# Patient Record
Sex: Male | Born: 1960 | Race: White | Hispanic: No | Marital: Married | Smoking: Never smoker
Health system: Southern US, Community
[De-identification: ages and names within clinical notes are randomized; demographics above are authoritative.]

## PROBLEM LIST (undated history)

## (undated) DIAGNOSIS — I10 Essential (primary) hypertension: Secondary | ICD-10-CM

## (undated) DIAGNOSIS — C801 Malignant (primary) neoplasm, unspecified: Secondary | ICD-10-CM

## (undated) HISTORY — PX: HERNIA REPAIR: SHX51

## (undated) HISTORY — PX: EYE SURGERY: SHX253

---

## 2016-03-09 ENCOUNTER — Other Ambulatory Visit: Payer: Self-pay | Admitting: Gastroenterology

## 2016-03-09 DIAGNOSIS — K6289 Other specified diseases of anus and rectum: Secondary | ICD-10-CM

## 2016-03-10 ENCOUNTER — Ambulatory Visit
Admission: RE | Admit: 2016-03-10 | Discharge: 2016-03-10 | Disposition: A | Discharge status: Home / Self Care | Payer: BLUE CROSS/BLUE SHIELD | Source: Ambulatory Visit | Attending: Gastroenterology | Admitting: Gastroenterology

## 2016-03-10 ENCOUNTER — Other Ambulatory Visit: Payer: Self-pay | Admitting: Gastroenterology

## 2016-03-10 DIAGNOSIS — K6289 Other specified diseases of anus and rectum: Secondary | ICD-10-CM

## 2016-03-10 MED ORDER — IOPAMIDOL (ISOVUE-300) INJECTION 61%
100.0000 mL | Freq: Once | INTRAVENOUS | Status: AC | PRN
  Administered 2016-03-10: 100 mL via INTRAVENOUS

## 2016-03-11 ENCOUNTER — Ambulatory Visit (HOSPITAL_COMMUNITY)
Admission: RE | Admit: 2016-03-11 | Discharge: 2016-03-11 | Disposition: A | Discharge status: Home / Self Care | Payer: BLUE CROSS/BLUE SHIELD | Source: Ambulatory Visit | Attending: Gastroenterology | Admitting: Gastroenterology

## 2016-03-11 ENCOUNTER — Encounter (HOSPITAL_COMMUNITY): Payer: Self-pay | Admitting: *Deleted

## 2016-03-11 ENCOUNTER — Encounter (HOSPITAL_COMMUNITY)
Admission: RE | Disposition: A | Discharge status: Home / Self Care | Payer: Self-pay | Source: Ambulatory Visit | Attending: Gastroenterology

## 2016-03-11 DIAGNOSIS — C20 Malignant neoplasm of rectum: Secondary | ICD-10-CM | POA: Diagnosis not present

## 2016-03-11 HISTORY — DX: Essential (primary) hypertension: I10

## 2016-03-11 HISTORY — PX: EUS: SHX5427

## 2016-03-11 HISTORY — PX: FLEXIBLE SIGMOIDOSCOPY: SHX5431

## 2016-03-11 SURGERY — ULTRASOUND, LOWER GI TRACT, ENDOSCOPIC
Anesthesia: Moderate Sedation

## 2016-03-11 MED ORDER — SODIUM CHLORIDE 0.9 % IV SOLN: INTRAVENOUS | Status: DC

## 2016-03-11 MED ORDER — FENTANYL CITRATE (PF) 100 MCG/2ML IJ SOLN
INTRAMUSCULAR | Status: AC
  Filled 2016-03-11: qty 2

## 2016-03-11 MED ORDER — FENTANYL CITRATE (PF) 100 MCG/2ML IJ SOLN
INTRAMUSCULAR | Status: DC | PRN
  Administered 2016-03-11 (×2): 25 ug via INTRAVENOUS

## 2016-03-11 MED ORDER — SPOT INK MARKER SYRINGE KIT
PACK | SUBMUCOSAL | Status: AC
  Filled 2016-03-11: qty 5

## 2016-03-11 MED ORDER — MIDAZOLAM HCL 5 MG/ML IJ SOLN
INTRAMUSCULAR | Status: AC
  Filled 2016-03-11: qty 2

## 2016-03-11 MED ORDER — SPOT INK MARKER SYRINGE KIT
PACK | SUBMUCOSAL | Status: DC | PRN
  Administered 2016-03-11: 1 mL via SUBMUCOSAL

## 2016-03-11 MED ORDER — MIDAZOLAM HCL 10 MG/2ML IJ SOLN
INTRAMUSCULAR | Status: DC | PRN
  Administered 2016-03-11: 1 mg via INTRAVENOUS
  Administered 2016-03-11 (×3): 2 mg via INTRAVENOUS

## 2016-03-11 NOTE — Discharge Instructions (Signed)

## 2016-03-11 NOTE — H&P (Signed)
Patient interval history reviewed.  Patient examined again.  There has been no change from documented H/P dated 03/10/16 (scanned into chart from our office) except as documented above.  Assessment:  1.  Rectal adenocarcinoma.  Plan:  1.  Endorectal ultrasound, for locoregional staging, with possible tattoo ([place] Ink) placement. 2.  Risks (bleeding, infection, bowel perforation that could require surgery, sedation-related changes in cardiopulmonary systems), benefits (identification and possible treatment of source of symptoms, exclusion of certain causes of symptoms), and alternatives (watchful waiting, radiographic imaging studies, empiric medical treatment) of endorectal ultrasound were explained to patient/family in detail and patient wishes to proceed.

## 2016-03-11 NOTE — Op Note (Signed)
[facility] Patient Name: Darryl Foster Procedure Date: 03/11/2016 MRN: [id number] Attending MD: Pedrohenrique Outlaw , MD Date of Birth: 12/24/1960 CSN:  Age: 54 Admit Type: Outpatient Procedure:                Lower EUS Indications:              Abnormal wall thickening in the rectum seen on CT                            scan, Rectal mucosal mass found on flex                            sig/colonoscopy (biopsy-proven adenocarcinoma) Providers:                Abass Outlaw, MD, Patricia Ford, RN, Sam Tetteh,                            Technician Referring MD:             James L. Edwards, MD, Alicia Thomas, MD Medicines:                Fentanyl 50 micrograms IV, Midazolam 6 mg IV Complications:            No immediate complications. Estimated Blood Loss:     Estimated blood loss was minimal. Procedure:                Pre-Anesthesia Assessment:                           - Prior to the procedure, a History and Physical                            was performed, and patient medications and                            allergies were reviewed. The patient's tolerance of                            previous anesthesia was also reviewed. The risks                            and benefits of the procedure and the sedation                            options and risks were discussed with the patient.                            All questions were answered, and informed consent                            was obtained. Prior Anticoagulants: The patient has                            taken no previous anticoagulant or antiplatelet                              agents. ASA Grade Assessment: I - A normal, healthy                            patient. After reviewing the risks and benefits,                            the patient was deemed in satisfactory condition to                            undergo the procedure.                           After obtaining informed consent, the endoscope was                             passed under direct vision. Throughout the                            procedure, the patient's blood pressure, pulse, and                            oxygen saturations were monitored continuously. The                            [id number] ([id number]) scope was introduced through                            the anus and advanced to the the sigmoid colon for                            ultrasound. The [id number] ([id number]) scope was                            introduced through the anus and advanced to the the                            sigmoid colon for ultrasound. The lower EUS was                            accomplished without difficulty. The patient                            tolerated the procedure well. The quality of the                            bowel preparation was good. Scope In: Scope Out: Findings:      The perianal and digital rectal examinations were normal.      Endoscopic Finding :      A sessile and ulcerated non-obstructing medium-sized mass was found in       the mid rectum. The mass was non-circumferential. No bleeding was       present. Area was tattooed with an injection of 1 mL of [place] ink.      Endosonographic Finding :        The perirectal space was normal.      No lymph nodes were seen in the perirectal region during endosonographic       examination of the rectum.      A hypoechoic and homogenous mass was found in the rectum. The mass was       lateral. The endosonographic borders were well-defined. The mass       measured 15 mm (in maximum diameter). There was sonographic evidence       suggesting invasion into the submucosa (Layer 3); the lesion appears to       closely border, perhaps even abut, the muscularis propria. There was no       sonographic evidence of invasion into the perirectal fat, visceral       peritoneum, adjacent structures or prostate. Impression:               - Malignant tumor in the mid rectum. Tattooed.                            - Endosonographic images of the perirectal space                            were unremarkable.                           - No lymph nodes were seen in the perirectal region                            during endosonographic examination of the rectum.                           - Rectal mass was visualized endosonographically. A                            tissue diagnosis was obtained prior to this exam.                            This is of adenocarcinoma. This was best felt to be                            staged uT1 uN0, but very early invasion into                            muscularis propria (uT2), while not favored, can                            not be definitively excluded. Moderate Sedation:      Moderate (conscious) sedation was administered by the endoscopy nurse       and supervised by the endoscopist. The following parameters were       monitored: oxygen saturation, heart rate, blood pressure, and response       to care. Recommendation:           - The patient will be observed post-procedure,                            until all  discharge criteria are met.                           - Discharge patient to home (via wheelchair).                           - Resume previous diet today.                           - Continue present medications.                           - Refer to a colo-rectal surgeon at appointment to                            be scheduled.                           - Return to referring physician as previously                            scheduled.                           - Return to GI clinic PRN. Procedure Code(s):        --- Professional ---                           45341, Sigmoidoscopy, flexible; with endoscopic                            ultrasound examination                           45335, Sigmoidoscopy, flexible; with directed                            submucosal injection(s), any substance Diagnosis Code(s):        --- Professional  ---                           C20, Malignant neoplasm of rectum                           K62.89, Other specified diseases of anus and rectum                           R93.3, Abnormal findings on diagnostic imaging of                            other parts of digestive tract CPT copyright 2016 American Medical Association. All rights reserved. The codes documented in this report are preliminary and upon coder review may  be revised to meet current compliance requirements. Eyad Outlaw, MD Nimesh Outlaw, MD 03/11/2016 8:19:42 AM This report has been signed electronically. Number of Addenda: 0 

## 2016-03-16 ENCOUNTER — Encounter (HOSPITAL_COMMUNITY): Payer: Self-pay | Admitting: Gastroenterology

## 2016-03-16 ENCOUNTER — Other Ambulatory Visit: Payer: Self-pay | Admitting: General Surgery

## 2016-03-16 NOTE — H&P (Signed)
History of Present Illness Darryl Ruff MD; [id number] 10:08 AM) Patient words: cancer.  The patient is a 55 year old male who presents with colorectal cancer. Pt goes by Darryl Foster. 55 year old male status post rectal cancer found on screening colonoscopy by Dr. Oletta Lamas. He underwent a rectal ultrasound which revealed a a 15 mm lateral mass with invasion into the submucosa and possible muscularis invasion, T1 N0 or possible T2 N0. This was tattooed with [place] ink. CT scans of the chest abdomen and pelvis reveal no signs of metastatic disease. There were some small bilateral pulmonary nodules that will need follow-up in the future in 3-6 months. CEA level was 1.5. The mass is located approximately 10 cm from the anal verge.   Other Problems Darryl Lorenzo, LPN; 624THL [id number] AM) High blood pressure Rectal Cancer  Past Surgical History Darryl Lorenzo, LPN; 624THL [id number] AM) No pertinent past surgical history  Diagnostic Studies History Darryl Lorenzo, LPN; 624THL [id number] AM) Colonoscopy within last year  Allergies Darryl Lorenzo, LPN; 624THL [id number] AM) No Known Drug Allergies 03/16/2016  Medication History Darryl Lorenzo, LPN; 624THL [id number] AM) Lisinopril (10MG  Tablet, Oral) Active. Nasacort (55MCG/ACT Aerosol Soln, Nasal) Active. Medications Reconciled  Social History Darryl Lorenzo, LPN; 624THL [id number] AM) Alcohol use Remotely quit alcohol use. Caffeine use Coffee, Tea. No drug use Tobacco use Never smoker.  Family History Darryl Lorenzo, LPN; 624THL [id number] AM) Diabetes Mellitus Father. Heart Disease Father. Heart disease in male family member before age 84 Hypertension Father. Thyroid problems Mother, Sister.     Review of Systems Darryl Billings Dockery LPN; 624THL [id number] AM) General Not Present- Appetite Loss, Chills, Fatigue, Fever, Night Sweats, Weight Gain and Weight Loss. Skin Not Present- Change in Wart/Mole, Dryness, Hives, Jaundice, New Lesions,  Non-Healing Wounds, Rash and Ulcer. HEENT Present- Seasonal Allergies and Wears glasses/contact lenses. Not Present- Earache, Hearing Loss, Hoarseness, Nose Bleed, Oral Ulcers, Ringing in the Ears, Sinus Pain, Sore Throat, Visual Disturbances and Yellow Eyes. Respiratory Not Present- Bloody sputum, Chronic Cough, Difficulty Breathing, Snoring and Wheezing. Breast Not Present- Breast Mass, Breast Pain, Nipple Discharge and Skin Changes. Cardiovascular Not Present- Chest Pain, Difficulty Breathing Lying Down, Leg Cramps, Palpitations, Rapid Heart Rate, Shortness of Breath and Swelling of Extremities. Gastrointestinal Not Present- Abdominal Pain, Bloating, Bloody Stool, Change in Bowel Habits, Chronic diarrhea, Constipation, Difficulty Swallowing, Excessive gas, Gets full quickly at meals, Hemorrhoids, Indigestion, Nausea, Rectal Pain and Vomiting. Male Genitourinary Not Present- Blood in Urine, Change in Urinary Stream, Frequency, Impotence, Nocturia, Painful Urination, Urgency and Urine Leakage. Musculoskeletal Not Present- Back Pain, Joint Pain, Joint Stiffness, Muscle Pain, Muscle Weakness and Swelling of Extremities. Neurological Not Present- Decreased Memory, Fainting, Headaches, Numbness, Seizures, Tingling, Tremor, Trouble walking and Weakness. Psychiatric Not Present- Anxiety, Bipolar, Change in Sleep Pattern, Depression, Fearful and Frequent crying. Endocrine Not Present- Cold Intolerance, Excessive Hunger, Hair Changes, Heat Intolerance, Hot flashes and New Diabetes. Hematology Not Present- Easy Bruising, Excessive bleeding, Gland problems, HIV and Persistent Infections.  Vitals Darryl Billings Dockery LPN; 624THL 075-GRM AM) 03/16/2016 9:35 AM Weight: 185.6 lb Height: 67in Body Surface Area: 1.96 m Body Mass Index: 29.07 kg/m  Temp.: 97.38F(Oral)  Pulse: 119 (Regular)  BP: 148/92 (Sitting, Left Arm, Standard)      Physical Exam Darryl Ruff MD; [id number] 10:44  AM)  General Mental Status-Alert. General Appearance-Not in acute distress. Build & Nutrition-Well nourished. Posture-Normal posture. Gait-Normal.  Head and Neck Head-normocephalic, atraumatic with no lesions or palpable masses. Trachea-midline.  Chest and Lung Exam Chest and lung exam reveals -  on auscultation, normal breath sounds, no adventitious sounds and normal vocal resonance.  Cardiovascular Cardiovascular examination reveals -normal heart sounds, regular rate and rhythm with no murmurs.  Abdomen Inspection Inspection of the abdomen reveals - No Hernias. Palpation/Percussion Palpation and Percussion of the abdomen reveal - Soft, Non Tender, No Rigidity (guarding), No hepatosplenomegaly and No Palpable abdominal masses.  Rectal Note: deferred  Neurologic Neurologic evaluation reveals -alert and oriented x 3 with no impairment of recent or remote memory, normal attention span and ability to concentrate, normal sensation and normal coordination.  Musculoskeletal Normal Exam - Bilateral-Upper Extremity Strength Normal and Lower Extremity Strength Normal.    Assessment & Plan Darryl Ruff MD; [id number] 10:04 AM)  RECTAL CANCER (C20) Impression: 55 year old male status post rectal cancer found on screening colonoscopy. Ultrasound reveals early stage tumor. I have recommended low anterior resection. We have discussed this in detail. It is unlikely the patient will need any chemotherapy unless something changes on his final pathology. The surgery and anatomy were described to the patient as well as the risks of surgery and the possible complications. These include: Bleeding, deep abdominal infections and possible wound complications such as hernia and infection, damage to adjacent structures, leak of surgical connections, which can lead to other surgeries and possibly an ostomy, possible need for other procedures, such as abscess drains in radiology,  possible prolonged hospital stay, possible diarrhea from removal of part of the colon, possible constipation from narcotics, possible bowel, bladder or sexual dysfunction if having rectal surgery, prolonged fatigue/weakness or appetite loss, possible early recurrence of of disease, possible complications of their medical problems such as heart disease or arrhythmias or lung problems, death (less than 1%). I believe the patient understands and wishes to proceed with the surgery.

## 2016-03-18 NOTE — Patient Instructions (Addendum)
Darryl Foster  03/18/2016   Your procedure is scheduled on: 03/27/2016    Report to [facility] Main  Entrance take [place]  elevators to 3rd floor to  [place] at   0700 AM.  Call this number if you have problems the morning of surgery [phone]   Remember: ONLY 1 PERSON MAY GO WITH YOU TO SHORT STAY TO GET  READY MORNING OF YOUR SURGERY.  Do not eat food or drink liquids :After Midnight.             FOLLOW BOWEL PREP INSTRUCTIONS PER MD OFFICE     Take these medicines the morning of surgery with A SIP OF WATER: none                                 You may not have any metal on your body including hair pins and              piercings  Do not wear jewelry,  lotions, powders or perfumes, deodorant                         Men may shave face and neck.   Do not bring valuables to the hospital. [place].  Contacts, dentures or bridgework may not be worn into surgery.  Leave suitcase in the car. After surgery it may be brought to your room.               Please read over the following fact sheets you were given: _____________________________________________________________________             [facility] - Preparing for Surgery Before surgery, you can play an important role.  Because skin is not sterile, your skin needs to be as free of germs as possible.  You can reduce the number of germs on your skin by washing with CHG (chlorahexidine gluconate) soap before surgery.  CHG is an antiseptic cleaner which kills germs and bonds with the skin to continue killing germs even after washing. Please DO NOT use if you have an allergy to CHG or antibacterial soaps.  If your skin becomes reddened/irritated stop using the CHG and inform your nurse when you arrive at Short Stay. Do not shave (including legs and underarms) for at least 48 hours prior to the first CHG shower.  You may shave your face/neck. Please  follow these instructions carefully:  1.  Shower with CHG Soap the night before surgery and the  morning of Surgery.  2.  If you choose to wash your hair, wash your hair first as usual with your  normal  shampoo.  3.  After you shampoo, rinse your hair and body thoroughly to remove the  shampoo.                           4.  Use CHG as you would any other liquid soap.  You can apply chg directly  to the skin and wash                       Gently with a scrungie or clean washcloth.  5.  Apply the CHG Soap to your body ONLY FROM THE NECK DOWN.   Do not use on face/ open                           Wound or open sores. Avoid contact with eyes, ears mouth and genitals (private parts).                       Wash face,  Genitals (private parts) with your normal soap.             6.  Wash thoroughly, paying special attention to the area where your surgery  will be performed.  7.  Thoroughly rinse your body with warm water from the neck down.  8.  DO NOT shower/wash with your normal soap after using and rinsing off  the CHG Soap.                9.  Pat yourself dry with a clean towel.            10.  Wear clean pajamas.            11.  Place clean sheets on your bed the night of your first shower and do not  sleep with pets. Day of Surgery : Do not apply any lotions/deodorants the morning of surgery.  Please wear clean clothes to the hospital/surgery center.  FAILURE TO FOLLOW THESE INSTRUCTIONS MAY RESULT IN THE CANCELLATION OF YOUR SURGERY PATIENT SIGNATURE_________________________________  NURSE SIGNATURE__________________________________  ________________________________________________________________________  WHAT IS A BLOOD TRANSFUSION? Blood Transfusion Information  A transfusion is the replacement of blood or some of its parts. Blood is made up of multiple cells which provide different functions.  Red blood cells carry oxygen and are used for blood loss replacement.  White blood cells  fight against infection.  Platelets control bleeding.  Plasma helps clot blood.  Other blood products are available for specialized needs, such as hemophilia or other clotting disorders. BEFORE THE TRANSFUSION  Who gives blood for transfusions?   Healthy volunteers who are fully evaluated to make sure their blood is safe. This is blood bank blood. Transfusion therapy is the safest it has ever been in the practice of medicine. Before blood is taken from a donor, a complete history is taken to make sure that person has no history of diseases nor engages in risky social behavior (examples are intravenous drug use or sexual activity with multiple partners). The donor's travel history is screened to minimize risk of transmitting infections, such as malaria. The donated blood is tested for signs of infectious diseases, such as HIV and hepatitis. The blood is then tested to be sure it is compatible with you in order to minimize the chance of a transfusion reaction. If you or a relative donates blood, this is often done in anticipation of surgery and is not appropriate for emergency situations. It takes many days to process the donated blood. RISKS AND COMPLICATIONS Although transfusion therapy is very safe and saves many lives, the main dangers of transfusion include:   Getting an infectious disease.  Developing a transfusion reaction. This is an allergic reaction to something in the blood you were given. Every precaution is taken to prevent this. The decision to have a blood transfusion has been considered carefully by your caregiver before blood is given. Blood is not given unless the benefits outweigh the risks. AFTER THE TRANSFUSION  Right after receiving a  blood transfusion, you will usually feel much better and more energetic. This is especially true if your red blood cells have gotten low (anemic). The transfusion raises the level of the red blood cells which carry oxygen, and this usually  causes an energy increase.  The nurse administering the transfusion will monitor you carefully for complications. HOME CARE INSTRUCTIONS  No special instructions are needed after a transfusion. You may find your energy is better. Speak with your caregiver about any limitations on activity for underlying diseases you may have. SEEK MEDICAL CARE IF:   Your condition is not improving after your transfusion.  You develop redness or irritation at the intravenous (IV) site. SEEK IMMEDIATE MEDICAL CARE IF:  Any of the following symptoms occur over the next 12 hours:  Shaking chills.  You have a temperature by mouth above 102 F (38.9 C), not controlled by medicine.  Chest, back, or muscle pain.  People around you feel you are not acting correctly or are confused.  Shortness of breath or difficulty breathing.  Dizziness and fainting.  You get a rash or develop hives.  You have a decrease in urine output.  Your urine turns a dark color or changes to pink, red, or brown. Any of the following symptoms occur over the next 10 days:  You have a temperature by mouth above 102 F (38.9 C), not controlled by medicine.  Shortness of breath.  Weakness after normal activity.  The white part of the eye turns yellow (jaundice).  You have a decrease in the amount of urine or are urinating less often.  Your urine turns a dark color or changes to pink, red, or brown. Document Released: 11/13/2000 Document Revised: 02/08/2012 Document Reviewed: 07/02/2008 [facility] Patient Information 2014 [place], [place].  _______________________________________________________________________

## 2016-03-20 ENCOUNTER — Encounter (HOSPITAL_COMMUNITY): Payer: Self-pay

## 2016-03-20 ENCOUNTER — Encounter (HOSPITAL_COMMUNITY)
Admission: RE | Admit: 2016-03-20 | Discharge: 2016-03-20 | Disposition: A | Discharge status: Home / Self Care | Payer: BLUE CROSS/BLUE SHIELD | Source: Ambulatory Visit | Attending: General Surgery | Admitting: General Surgery

## 2016-03-20 DIAGNOSIS — Z01810 Encounter for preprocedural cardiovascular examination: Secondary | ICD-10-CM | POA: Insufficient documentation

## 2016-03-20 DIAGNOSIS — Z01812 Encounter for preprocedural laboratory examination: Secondary | ICD-10-CM | POA: Diagnosis not present

## 2016-03-20 HISTORY — DX: Malignant (primary) neoplasm, unspecified: C80.1

## 2016-03-20 LAB — ABO/RH: ABO/RH(D): O POS

## 2016-03-20 NOTE — Pre-Procedure Instructions (Signed)
CBC with diff, CMP - 03/09/16 on chart CXR - 03/10/16 epic

## 2016-03-21 LAB — HEMOGLOBIN A1C
HEMOGLOBIN A1C: 6.4 % — AB (ref 4.8–5.6)
MEAN PLASMA GLUCOSE: 137 mg/dL

## 2016-03-23 NOTE — Progress Notes (Signed)
Final EKg done 03/20/16 in EPIC.

## 2016-03-27 ENCOUNTER — Inpatient Hospital Stay (HOSPITAL_COMMUNITY): Payer: BLUE CROSS/BLUE SHIELD | Admitting: Certified Registered"

## 2016-03-27 ENCOUNTER — Encounter (HOSPITAL_COMMUNITY): Payer: Self-pay | Admitting: Certified Registered"

## 2016-03-27 ENCOUNTER — Encounter (HOSPITAL_COMMUNITY)
Admission: RE | Disposition: A | Discharge status: Home / Self Care | Payer: Self-pay | Source: Ambulatory Visit | Attending: General Surgery

## 2016-03-27 ENCOUNTER — Inpatient Hospital Stay (HOSPITAL_COMMUNITY)
Admission: RE | Admit: 2016-03-27 | Discharge: 2016-03-30 | DRG: 331 | Disposition: A | Discharge status: Home / Self Care | Payer: BLUE CROSS/BLUE SHIELD | Source: Ambulatory Visit | Attending: General Surgery | Admitting: General Surgery

## 2016-03-27 DIAGNOSIS — I10 Essential (primary) hypertension: Secondary | ICD-10-CM | POA: Diagnosis present

## 2016-03-27 DIAGNOSIS — C19 Malignant neoplasm of rectosigmoid junction: Secondary | ICD-10-CM | POA: Diagnosis present

## 2016-03-27 DIAGNOSIS — Z833 Family history of diabetes mellitus: Secondary | ICD-10-CM | POA: Diagnosis not present

## 2016-03-27 DIAGNOSIS — C20 Malignant neoplasm of rectum: Secondary | ICD-10-CM | POA: Diagnosis present

## 2016-03-27 DIAGNOSIS — Z8249 Family history of ischemic heart disease and other diseases of the circulatory system: Secondary | ICD-10-CM

## 2016-03-27 DIAGNOSIS — R918 Other nonspecific abnormal finding of lung field: Secondary | ICD-10-CM | POA: Diagnosis present

## 2016-03-27 DIAGNOSIS — Z79899 Other long term (current) drug therapy: Secondary | ICD-10-CM | POA: Diagnosis not present

## 2016-03-27 HISTORY — PX: XI ROBOTIC ASSISTED LOWER ANTERIOR RESECTION: SHX6558

## 2016-03-27 LAB — TYPE AND SCREEN
ABO/RH(D): O POS
Antibody Screen: NEGATIVE

## 2016-03-27 SURGERY — RESECTION, RECTUM, LOW ANTERIOR, ROBOT-ASSISTED
Anesthesia: General | Site: Abdomen

## 2016-03-27 MED ORDER — OXYCODONE HCL 5 MG PO TABS: 5.0000 mg | ORAL_TABLET | Freq: Once | ORAL | Status: DC | PRN

## 2016-03-27 MED ORDER — SUGAMMADEX SODIUM 200 MG/2ML IV SOLN
INTRAVENOUS | Status: AC
  Filled 2016-03-27: qty 2

## 2016-03-27 MED ORDER — FLUTICASONE PROPIONATE 50 MCG/ACT NA SUSP
1.0000 | Freq: Every day | NASAL | Status: DC
  Administered 2016-03-27 – 2016-03-29 (×3): 1 via NASAL
  Filled 2016-03-27: qty 16

## 2016-03-27 MED ORDER — ONDANSETRON HCL 4 MG PO TABS: 4.0000 mg | ORAL_TABLET | Freq: Four times a day (QID) | ORAL | Status: DC | PRN

## 2016-03-27 MED ORDER — OXYCODONE HCL 5 MG/5ML PO SOLN
5.0000 mg | Freq: Once | ORAL | Status: DC | PRN
  Filled 2016-03-27: qty 5

## 2016-03-27 MED ORDER — HYDROMORPHONE HCL 1 MG/ML IJ SOLN
INTRAMUSCULAR | Status: AC
  Filled 2016-03-27: qty 1

## 2016-03-27 MED ORDER — PHENYLEPHRINE HCL 10 MG/ML IJ SOLN
INTRAMUSCULAR | Status: DC | PRN
  Administered 2016-03-27 (×4): 80 ug via INTRAVENOUS

## 2016-03-27 MED ORDER — ACETAMINOPHEN 500 MG PO TABS
1000.0000 mg | ORAL_TABLET | Freq: Four times a day (QID) | ORAL | Status: AC
  Administered 2016-03-27 – 2016-03-28 (×4): 1000 mg via ORAL
  Filled 2016-03-27 (×4): qty 2

## 2016-03-27 MED ORDER — ENOXAPARIN SODIUM 40 MG/0.4ML ~~LOC~~ SOLN
40.0000 mg | SUBCUTANEOUS | Status: DC
  Administered 2016-03-28 – 2016-03-30 (×3): 40 mg via SUBCUTANEOUS
  Filled 2016-03-27 (×4): qty 0.4

## 2016-03-27 MED ORDER — ONDANSETRON HCL 4 MG/2ML IJ SOLN
INTRAMUSCULAR | Status: DC | PRN
  Administered 2016-03-27: 4 mg via INTRAVENOUS

## 2016-03-27 MED ORDER — MIDAZOLAM HCL 5 MG/5ML IJ SOLN
INTRAMUSCULAR | Status: DC | PRN
  Administered 2016-03-27: 2 mg via INTRAVENOUS

## 2016-03-27 MED ORDER — TRIAMCINOLONE ACETONIDE 55 MCG/ACT NA AERO: 1.0000 | INHALATION_SPRAY | Freq: Every day | NASAL | Status: DC

## 2016-03-27 MED ORDER — ALBUMIN HUMAN 5 % IV SOLN
INTRAVENOUS | Status: DC | PRN
  Administered 2016-03-27: 10:00:00 via INTRAVENOUS

## 2016-03-27 MED ORDER — SUGAMMADEX SODIUM 200 MG/2ML IV SOLN
INTRAVENOUS | Status: DC | PRN
  Administered 2016-03-27: 200 mg via INTRAVENOUS

## 2016-03-27 MED ORDER — DIPHENHYDRAMINE HCL 50 MG/ML IJ SOLN: 25.0000 mg | Freq: Four times a day (QID) | INTRAMUSCULAR | Status: DC | PRN

## 2016-03-27 MED ORDER — CEFOTETAN DISODIUM-DEXTROSE 2-2.08 GM-% IV SOLR
INTRAVENOUS | Status: AC
  Filled 2016-03-27: qty 50

## 2016-03-27 MED ORDER — ALBUMIN HUMAN 5 % IV SOLN
INTRAVENOUS | Status: AC
  Filled 2016-03-27: qty 250

## 2016-03-27 MED ORDER — ALVIMOPAN 12 MG PO CAPS
12.0000 mg | ORAL_CAPSULE | Freq: Two times a day (BID) | ORAL | Status: DC
  Administered 2016-03-28 – 2016-03-29 (×3): 12 mg via ORAL
  Filled 2016-03-27 (×6): qty 1

## 2016-03-27 MED ORDER — ROCURONIUM BROMIDE 100 MG/10ML IV SOLN
INTRAVENOUS | Status: DC | PRN
  Administered 2016-03-27: 20 mg via INTRAVENOUS
  Administered 2016-03-27: 10 mg via INTRAVENOUS
  Administered 2016-03-27: 50 mg via INTRAVENOUS
  Administered 2016-03-27: 10 mg via INTRAVENOUS
  Administered 2016-03-27: 20 mg via INTRAVENOUS

## 2016-03-27 MED ORDER — MORPHINE SULFATE (PF) 2 MG/ML IV SOLN
2.0000 mg | INTRAVENOUS | Status: DC | PRN
  Administered 2016-03-27 – 2016-03-28 (×5): 2 mg via INTRAVENOUS
  Filled 2016-03-27 (×5): qty 1

## 2016-03-27 MED ORDER — LIDOCAINE HCL (CARDIAC) 20 MG/ML IV SOLN
INTRAVENOUS | Status: DC | PRN
  Administered 2016-03-27: 100 mg via INTRAVENOUS

## 2016-03-27 MED ORDER — PROPOFOL 10 MG/ML IV BOLUS
INTRAVENOUS | Status: DC | PRN
  Administered 2016-03-27: 200 mg via INTRAVENOUS

## 2016-03-27 MED ORDER — LACTATED RINGERS IV SOLN
INTRAVENOUS | Status: DC
  Administered 2016-03-27 (×2): via INTRAVENOUS

## 2016-03-27 MED ORDER — ONDANSETRON HCL 4 MG/2ML IJ SOLN: 4.0000 mg | Freq: Four times a day (QID) | INTRAMUSCULAR | Status: DC | PRN

## 2016-03-27 MED ORDER — ALVIMOPAN 12 MG PO CAPS
12.0000 mg | ORAL_CAPSULE | Freq: Once | ORAL | Status: AC
  Administered 2016-03-27: 12 mg via ORAL
  Filled 2016-03-27: qty 1

## 2016-03-27 MED ORDER — DEXTROSE 5 % IV SOLN
2.0000 g | INTRAVENOUS | Status: AC
  Administered 2016-03-27: 2 g via INTRAVENOUS
  Filled 2016-03-27: qty 2

## 2016-03-27 MED ORDER — DIPHENHYDRAMINE HCL 25 MG PO CAPS: 25.0000 mg | ORAL_CAPSULE | Freq: Four times a day (QID) | ORAL | Status: DC | PRN

## 2016-03-27 MED ORDER — BUPIVACAINE-EPINEPHRINE 0.25% -1:200000 IJ SOLN
INTRAMUSCULAR | Status: DC | PRN
  Administered 2016-03-27: 30 mL

## 2016-03-27 MED ORDER — KCL IN DEXTROSE-NACL 20-5-0.45 MEQ/L-%-% IV SOLN
INTRAVENOUS | Status: DC
Start: 2016-03-27 — End: 2016-03-30
  Administered 2016-03-27 – 2016-03-28 (×3): via INTRAVENOUS
  Filled 2016-03-27 (×5): qty 1000

## 2016-03-27 MED ORDER — PROPOFOL 10 MG/ML IV BOLUS
INTRAVENOUS | Status: AC
  Filled 2016-03-27: qty 20

## 2016-03-27 MED ORDER — LIDOCAINE HCL (CARDIAC) 20 MG/ML IV SOLN
INTRAVENOUS | Status: AC
  Filled 2016-03-27: qty 5

## 2016-03-27 MED ORDER — DEXAMETHASONE SODIUM PHOSPHATE 10 MG/ML IJ SOLN
INTRAMUSCULAR | Status: AC
  Filled 2016-03-27: qty 1

## 2016-03-27 MED ORDER — ONDANSETRON HCL 4 MG/2ML IJ SOLN
INTRAMUSCULAR | Status: AC
  Filled 2016-03-27: qty 2

## 2016-03-27 MED ORDER — FENTANYL CITRATE (PF) 250 MCG/5ML IJ SOLN
INTRAMUSCULAR | Status: AC
Start: 2016-03-27 — End: 2016-03-27
  Filled 2016-03-27: qty 5

## 2016-03-27 MED ORDER — BUPIVACAINE-EPINEPHRINE (PF) 0.25% -1:200000 IJ SOLN
INTRAMUSCULAR | Status: AC
  Filled 2016-03-27: qty 30

## 2016-03-27 MED ORDER — LISINOPRIL 10 MG PO TABS
10.0000 mg | ORAL_TABLET | Freq: Every day | ORAL | Status: DC
  Administered 2016-03-27 – 2016-03-29 (×3): 10 mg via ORAL
  Filled 2016-03-27 (×4): qty 1

## 2016-03-27 MED ORDER — EPHEDRINE SULFATE 50 MG/ML IJ SOLN
INTRAMUSCULAR | Status: AC
  Filled 2016-03-27: qty 1

## 2016-03-27 MED ORDER — MIDAZOLAM HCL 2 MG/2ML IJ SOLN
INTRAMUSCULAR | Status: AC
  Filled 2016-03-27: qty 2

## 2016-03-27 MED ORDER — HEPARIN SODIUM (PORCINE) 5000 UNIT/ML IJ SOLN
INTRAMUSCULAR | Status: AC
  Filled 2016-03-27: qty 1

## 2016-03-27 MED ORDER — ROCURONIUM BROMIDE 50 MG/5ML IV SOLN
INTRAVENOUS | Status: AC
Start: 2016-03-27 — End: 2016-03-27
  Filled 2016-03-27: qty 1

## 2016-03-27 MED ORDER — EPHEDRINE SULFATE 50 MG/ML IJ SOLN
INTRAMUSCULAR | Status: DC | PRN
  Administered 2016-03-27 (×2): 10 mg via INTRAVENOUS

## 2016-03-27 MED ORDER — HYDROMORPHONE HCL 1 MG/ML IJ SOLN
0.2500 mg | INTRAMUSCULAR | Status: DC | PRN
  Administered 2016-03-27: 0.5 mg via INTRAVENOUS

## 2016-03-27 MED ORDER — FENTANYL CITRATE (PF) 100 MCG/2ML IJ SOLN
INTRAMUSCULAR | Status: DC | PRN
  Administered 2016-03-27 (×2): 50 ug via INTRAVENOUS
  Administered 2016-03-27: 100 ug via INTRAVENOUS
  Administered 2016-03-27: 50 ug via INTRAVENOUS

## 2016-03-27 MED ORDER — HYDROMORPHONE HCL 2 MG/ML IJ SOLN
INTRAMUSCULAR | Status: AC
  Filled 2016-03-27: qty 1

## 2016-03-27 MED ORDER — ROCURONIUM BROMIDE 50 MG/5ML IV SOLN
INTRAVENOUS | Status: AC
  Filled 2016-03-27: qty 1

## 2016-03-27 MED ORDER — HYDROMORPHONE HCL 1 MG/ML IJ SOLN
INTRAMUSCULAR | Status: DC | PRN
  Administered 2016-03-27 (×2): 1 mg via INTRAVENOUS

## 2016-03-27 MED ORDER — LACTATED RINGERS IR SOLN
Status: DC | PRN
  Administered 2016-03-27: 1000 mL

## 2016-03-27 MED ORDER — 0.9 % SODIUM CHLORIDE (POUR BTL) OPTIME
TOPICAL | Status: DC | PRN
  Administered 2016-03-27: 2000 mL

## 2016-03-27 MED ORDER — DEXAMETHASONE SODIUM PHOSPHATE 10 MG/ML IJ SOLN
INTRAMUSCULAR | Status: DC | PRN
  Administered 2016-03-27: 10 mg via INTRAVENOUS

## 2016-03-27 MED ORDER — HEPARIN SODIUM (PORCINE) 5000 UNIT/ML IJ SOLN
5000.0000 [IU] | Freq: Three times a day (TID) | INTRAMUSCULAR | Status: DC
  Administered 2016-03-27: 5000 [IU] via SUBCUTANEOUS
  Filled 2016-03-27 (×6): qty 1

## 2016-03-27 MED ORDER — DEXTROSE 5 % IV SOLN
2.0000 g | Freq: Two times a day (BID) | INTRAVENOUS | Status: AC
  Administered 2016-03-27: 2 g via INTRAVENOUS
  Filled 2016-03-27: qty 2

## 2016-03-27 MED ORDER — LACTATED RINGERS IV SOLN
INTRAVENOUS | Status: DC | PRN
  Administered 2016-03-27 (×3): via INTRAVENOUS

## 2016-03-27 SURGICAL SUPPLY — 88 items
BLADE EXTENDED COATED 6.5IN (ELECTRODE) IMPLANT
CANNULA REDUC XI 12-8 STAPL (CANNULA) ×1
CANNULA REDUC XI 12-8MM STAPL (CANNULA) ×1
CANNULA REDUCER 12-8 DVNC XI (CANNULA) ×1 IMPLANT
CELLS DAT CNTRL 66122 CELL SVR (MISCELLANEOUS) IMPLANT
CLIP LIGATING HEM O LOK PURPLE (MISCELLANEOUS) IMPLANT
CLIP LIGATING HEMOLOK MED (MISCELLANEOUS) IMPLANT
COUNTER NEEDLE 20 DBL MAG RED (NEEDLE) ×3 IMPLANT
COVER MAYO STAND STRL (DRAPES) ×6 IMPLANT
COVER TIP SHEARS 8 DVNC (MISCELLANEOUS) ×1 IMPLANT
COVER TIP SHEARS 8MM DA VINCI (MISCELLANEOUS) ×2
DECANTER SPIKE VIAL GLASS SM (MISCELLANEOUS) IMPLANT
DEVICE TROCAR PUNCTURE CLOSURE (ENDOMECHANICALS) IMPLANT
DRAPE ARM DVNC X/XI (DISPOSABLE) ×4 IMPLANT
DRAPE COLUMN DVNC XI (DISPOSABLE) ×1 IMPLANT
DRAPE DA VINCI XI ARM (DISPOSABLE) ×8
DRAPE DA VINCI XI COLUMN (DISPOSABLE) ×2
DRAPE SURG IRRIG POUCH 19X23 (DRAPES) ×3 IMPLANT
DRSG OPSITE POSTOP 4X10 (GAUZE/BANDAGES/DRESSINGS) IMPLANT
DRSG OPSITE POSTOP 4X6 (GAUZE/BANDAGES/DRESSINGS) ×3 IMPLANT
DRSG OPSITE POSTOP 4X8 (GAUZE/BANDAGES/DRESSINGS) IMPLANT
ELECT PENCIL ROCKER SW 15FT (MISCELLANEOUS) ×6 IMPLANT
ELECT REM PT RETURN 15FT ADLT (MISCELLANEOUS) ×3 IMPLANT
ENDOLOOP SUT PDS II  0 18 (SUTURE)
ENDOLOOP SUT PDS II 0 18 (SUTURE) IMPLANT
EVACUATOR SILICONE 100CC (DRAIN) IMPLANT
GAUZE SPONGE 4X4 12PLY STRL (GAUZE/BANDAGES/DRESSINGS) IMPLANT
GLOVE BIO SURGEON STRL SZ 6.5 (GLOVE) ×6 IMPLANT
GLOVE BIO SURGEONS STRL SZ 6.5 (GLOVE) ×3
GLOVE BIOGEL PI IND STRL 7.0 (GLOVE) ×3 IMPLANT
GLOVE BIOGEL PI INDICATOR 7.0 (GLOVE) ×6
GOWN STRL REUS W/TWL 2XL LVL3 (GOWN DISPOSABLE) ×9 IMPLANT
GOWN STRL REUS W/TWL XL LVL3 (GOWN DISPOSABLE) ×12 IMPLANT
HOLDER FOLEY CATH W/STRAP (MISCELLANEOUS) ×3 IMPLANT
LEGGING LITHOTOMY PAIR STRL (DRAPES) ×3 IMPLANT
LIQUID BAND (GAUZE/BANDAGES/DRESSINGS) ×3 IMPLANT
NEEDLE INSUFFLATION 14GA 120MM (NEEDLE) ×3 IMPLANT
PACK CARDIOVASCULAR III (CUSTOM PROCEDURE TRAY) ×3 IMPLANT
PACK COLON (CUSTOM PROCEDURE TRAY) ×3 IMPLANT
PORT LAP GEL ALEXIS MED 5-9CM (MISCELLANEOUS) IMPLANT
RTRCTR WOUND ALEXIS 18CM MED (MISCELLANEOUS)
SCISSORS LAP 5X35 DISP (ENDOMECHANICALS) ×3 IMPLANT
SEAL CANN UNIV 5-8 DVNC XI (MISCELLANEOUS) ×3 IMPLANT
SEAL XI 5MM-8MM UNIVERSAL (MISCELLANEOUS) ×6
SEALER VESSEL DA VINCI XI (MISCELLANEOUS) ×2
SEALER VESSEL EXT DVNC XI (MISCELLANEOUS) ×1 IMPLANT
SET BI-LUMEN FLTR TB AIRSEAL (TUBING) ×6 IMPLANT
SET TUBE IRRIG SUCTION NO TIP (IRRIGATION / IRRIGATOR) ×3 IMPLANT
SLEEVE XCEL OPT CAN 5 100 (ENDOMECHANICALS) IMPLANT
SOLUTION ELECTROLUBE (MISCELLANEOUS) ×3 IMPLANT
SPONGE DRAIN TRACH 4X4 STRL 2S (GAUZE/BANDAGES/DRESSINGS) ×3 IMPLANT
STAPLER 45 BLU RELOAD XI (STAPLE) IMPLANT
STAPLER 45 BLUE RELOAD XI (STAPLE)
STAPLER 45 GREEN RELOAD XI (STAPLE) ×6
STAPLER 45 GRN RELOAD XI (STAPLE) ×3 IMPLANT
STAPLER CANNULA SEAL DVNC XI (STAPLE) ×1 IMPLANT
STAPLER CANNULA SEAL XI (STAPLE) ×2
STAPLER CIRC ILS CVD 33MM 37CM (STAPLE) ×3 IMPLANT
STAPLER SHEATH (SHEATH) ×2
STAPLER SHEATH ENDOWRIST DVNC (SHEATH) ×1 IMPLANT
STAPLER VISISTAT 35W (STAPLE) ×3 IMPLANT
SUT NOVA 1 T20/GS 25DT (SUTURE) ×6 IMPLANT
SUT PDS AB 1 CTX 36 (SUTURE) IMPLANT
SUT PDS AB 1 TP1 96 (SUTURE) IMPLANT
SUT PROLENE 2 0 KS (SUTURE) ×3 IMPLANT
SUT SILK 2 0 (SUTURE) ×2
SUT SILK 2 0 SH CR/8 (SUTURE) ×3 IMPLANT
SUT SILK 2-0 18XBRD TIE 12 (SUTURE) ×1 IMPLANT
SUT SILK 3 0 (SUTURE) ×2
SUT SILK 3 0 SH CR/8 (SUTURE) ×3 IMPLANT
SUT SILK 3-0 18XBRD TIE 12 (SUTURE) ×1 IMPLANT
SUT V-LOC BARB 180 2/0GR6 GS22 (SUTURE)
SUT VIC AB 2-0 SH 18 (SUTURE) ×3 IMPLANT
SUT VIC AB 2-0 SH 27 (SUTURE) ×2
SUT VIC AB 2-0 SH 27X BRD (SUTURE) ×1 IMPLANT
SUT VIC AB 3-0 SH 18 (SUTURE) IMPLANT
SUT VIC AB 4-0 PS2 27 (SUTURE) ×6 IMPLANT
SUTURE V-LC BRB 180 2/0GR6GS22 (SUTURE) IMPLANT
SYRINGE 10CC LL (SYRINGE) ×3 IMPLANT
SYS LAPSCP GELPORT 120MM (MISCELLANEOUS)
SYSTEM LAPSCP GELPORT 120MM (MISCELLANEOUS) IMPLANT
TOWEL OR 17X26 10 PK STRL BLUE (TOWEL DISPOSABLE) ×3 IMPLANT
TOWEL OR NON WOVEN STRL DISP B (DISPOSABLE) ×3 IMPLANT
TRAY FOLEY W/METER SILVER 14FR (SET/KITS/TRAYS/PACK) IMPLANT
TRAY FOLEY W/METER SILVER 16FR (SET/KITS/TRAYS/PACK) ×3 IMPLANT
TROCAR BLADELESS OPT 5 100 (ENDOMECHANICALS) IMPLANT
TUBING CONNECTING 10 (TUBING) IMPLANT
TUBING CONNECTING 10' (TUBING)

## 2016-03-27 NOTE — Interval H&P Note (Signed)
History and Physical Interval Note:  03/27/2016 8:17 AM  Darryl Foster  has presented today for surgery, with the diagnosis of Rectal cancer   The various methods of treatment have been discussed with the patient and family. After consideration of risks, benefits and other options for treatment, the patient has consented to  Procedure(s): XI ROBOTIC ASSISTED LOWER ANTERIOR RESECTION (N/A) as a surgical intervention .  The patient's history has been reviewed, patient examined, no change in status, stable for surgery.  I have reviewed the patient's chart and labs.  Questions were answered to the patient's satisfaction.     Rosario Adie, MD  Colorectal and [facility]

## 2016-03-27 NOTE — Anesthesia Postprocedure Evaluation (Signed)
Anesthesia Post Note  Patient: Tayvian Salgueiro  Procedure(s) Performed: Procedure(s) (LRB): XI ROBOTIC ASSISTED LOWER ANTERIOR RESECTION (N/A)  Patient location during evaluation: PACU Anesthesia Type: General Level of consciousness: awake and alert and patient cooperative Pain management: pain level controlled Vital Signs Assessment: post-procedure vital signs reviewed and stable Respiratory status: spontaneous breathing and respiratory function stable Cardiovascular status: stable Anesthetic complications: no    Last Vitals:  Filed Vitals:   03/27/16 1430 03/27/16 1445  BP: 132/79 152/86  Pulse: 124 123  Temp:    Resp: 23 20    Last Pain:  Filed Vitals:   03/27/16 1448  PainSc: [place]

## 2016-03-27 NOTE — Anesthesia Preprocedure Evaluation (Signed)
Anesthesia Evaluation  Patient identified by MRN, date of birth, ID band Patient awake    Reviewed: Allergy & Precautions, NPO status , Patient's Chart, lab work & pertinent test results  Airway Mallampati: II   Neck ROM: full    Dental   Pulmonary neg pulmonary ROS,    breath sounds clear to auscultation       Cardiovascular hypertension,  Rhythm:regular Rate:Normal     Neuro/Psych    GI/Hepatic Rectal CA.   Endo/Other    Renal/GU      Musculoskeletal   Abdominal   Peds  Hematology   Anesthesia Other Findings   Reproductive/Obstetrics                             Anesthesia Physical Anesthesia Plan  ASA: II  Anesthesia Plan: General   Post-op Pain Management:    Induction: Intravenous  Airway Management Planned: Oral ETT  Additional Equipment:   Intra-op Plan:   Post-operative Plan: Extubation in OR  Informed Consent: I have reviewed the patients History and Physical, chart, labs and discussed the procedure including the risks, benefits and alternatives for the proposed anesthesia with the patient or authorized representative who has indicated his/her understanding and acceptance.     Plan Discussed with: CRNA, Anesthesiologist and Surgeon  Anesthesia Plan Comments:         Anesthesia Quick Evaluation

## 2016-03-27 NOTE — Op Note (Signed)
03/27/2016  2:26 PM  PATIENT:  Darryl Foster  55 y.o. male  Patient Care Team: Josetta Huddle, MD as PCP - General (Internal Medicine)  PRE-OPERATIVE DIAGNOSIS:  Rectal cancer   POST-OPERATIVE DIAGNOSIS:  Rectal cancer  PROCEDURE:  XI ROBOTIC ASSISTED LOWER ANTERIOR RESECTION   Surgeon(s): Leighton Ruff, MD Michael Boston, MD  ASSISTANT: Dr Johney Maine   ANESTHESIA:   local and general  EBL: 21ml  Total I/O In: 3000 [I.V.:2750; IV Piggyback:250] Out: 425 [Urine:200; Drains:175; Blood:50]  Delay start of Pharmacological VTE agent (>24hrs) due to surgical blood loss or risk of bleeding:  no  DRAINS: (67F) Jackson-Pratt drain(s) with closed bulb suction in the pelvis   SPECIMEN:  Source of Specimen:  rectosigmoid  DISPOSITION OF SPECIMEN:  PATHOLOGY  COUNTS:  YES  PLAN OF CARE: Admit to inpatient   PATIENT DISPOSITION:  PACU - hemodynamically stable.  INDICATION:    55 year old male who presented to the office with a rectal tumor found on screening colonoscopy. Biopsy showed adenocarcinoma. Rectal ultrasound shows a T1 or possible T2 lesion with no enlarged lymph nodes. CT scan showed no signs of metastatic disease except for some small pulmonary nodules which are too small to be characterized at this point.  I recommended segmental resection:  The anatomy & physiology of the digestive tract was discussed.  The pathophysiology was discussed.  Natural history risks without surgery was discussed.   I worked to give an overview of the disease and the frequent need to have multispecialty involvement.  I feel the risks of no intervention will lead to serious problems that outweigh the operative risks; therefore, I recommended a partial colectomy to remove the pathology.  Laparoscopic & open techniques were discussed.   Risks such as bleeding, infection, abscess, leak, reoperation, possible ostomy, hernia, heart attack, death, and other risks were discussed.  I noted a good likelihood this  will help address the problem.   Goals of post-operative recovery were discussed as well.    The patient expressed understanding & wished to proceed with surgery.  OR FINDINGS:   Patient had a right lateral rectal tumor approximately 9 cm from the anal verge  No obvious metastatic disease on visceral parietal peritoneum or liver.  The anastomosis rests 7 cm from the anal verge by rigid proctoscopy.  DESCRIPTION:   Informed consent was confirmed.  The patient underwent general anaesthesia without difficulty.  The patient was positioned appropriately.  VTE prevention in place.  The patient's abdomen was clipped, prepped, & draped in a sterile fashion.  Surgical timeout confirmed our plan.  The patient was positioned in reverse Trendelenburg.  Abdominal entry was gained using a varies needle in the left upper quadrant.  Entry was clean.  I induced carbon dioxide insufflation.  An 8 mm robotic port was placed. Camera inspection revealed no injury.  Extra ports were carefully placed under direct laparoscopic visualization. A 12 mm port was placed in the right lower quadrant. The robot was docked to the patient's left side. Instruments were placed under direct visualization after the arms were docked to the ports.    I reflected the greater omentum and the upper abdomen the small bowel in the upper abdomen. I scored the base of peritoneum of the right side of the mesentery of the left colon from the ligament of Treitz to the peritoneal reflection of the mid rectum.  I elevated the sigmoid mesentery and enetered into the retro-mesenteric plane. We were able to identify the left ureter and  gonadal vessels. We kept those posterior within the retroperitoneum and elevated the left colon mesentery off that. I did isolated IMA pedicle but did not ligate it yet.  I continued distally and got into the avascular plane posterior to the mesorectum. I began to dissect posteriorly into this space down to the level of  the coccyx. I then dissected out laterally making sure to stay in the mesenteric plane. I then opened the peritoneal reflection using electrocautery. Dissection was carried down anteriorly between the rectum and seminal vesicles. I then dissected the remaining lateral portions of the mesentery until I was down below the area of the tumor. No tattoo was ever seen. I evaluated the rectum with a digital exam and marked a spot approximately 1 cm distal to the tumor border. We then transected the remaining posterior mesentery using electrocautery and vessel sealer. I then used 3 green load staplers to transect the rectum at this spot.  I then skeletonized the inferior mesenteric artery pedicle.  I went down to its takeoff from the aorta.  After confirming the left ureter was out of the way, I went ahead and ligated the inferior mesenteric artery pedicle with bipolar robotic vessel sealer ~2cm above its takeoff from the aorta.  I did ligate the vein in a similar fashion.  We ensured hemostasis. I skeletonized the mesorectum at the junction at the proximal rectum using blunt dissection & bipolar vessel sealer.  I then used the vessel sealer to divide the mesentery of the sigmoid colon.  I mobilized the left colon in a lateral to medial fashion off the line of Toldt up towards the splenic flexure to ensure good mobilization of the left colon to reach into the pelvis. After this was completed, we confirmed hemostasis in the pelvis. There was no active bleeding noted. I then enlarged a 12 mm site and placed an [place] wound protector.  I brought out the distal rectum and sigmoid colon.  A pursestring device was placed over the previously marked colon. The colon was then transected using cautery. There was good mucosal bleeding noted. A 33 mm EEA anvil was placed after a pursestring was created using the purse stringer device. The fat was cleared from around the anvil to allow for placement of the staple line. This was then  dropped back into the abdomen and the abdomen was insufflated once again after the cap was placed on the [place]. An anastomosis was created without difficulty. There was no tension noted. There was no leak when tested with insufflation under water. A 19 [place] Blake drain was placed into the pelvis and brought out through the 5 mm right lower quadrant port site. This was secured into place with a 2-0 nylon suture. The remaining ports and Alexis were removed. We switched to clean gowns, gloves, drapes and instruments. The posterior fascia and peritoneum of the extraction site was closed using a 2-0 Vicryl suture. The anterior fascia was closed using interrupted Novafil sutures. The subcutaneous layer was closed using interrupted 2-0 Vicryl sutures. The skin was closed using a running 4-0 Vicryl subcuticular suture. The remaining port sites were also closed using 4-0 Vicryl suture. Dermabond was placed on the port sites and a dressing was placed over the extraction site. The patient was awakened from anesthesia and sent to the post anesthesia care unit in stable condition. All counts were correct per operating room staff.

## 2016-03-27 NOTE — Transfer of Care (Signed)
Immediate Anesthesia Transfer of Care Note  Patient: Darryl Foster  Procedure(s) Performed: Procedure(s): XI ROBOTIC ASSISTED LOWER ANTERIOR RESECTION (N/A)  Patient Location: PACU  Anesthesia Type:General  Level of Consciousness: awake, alert  and oriented  Airway & Oxygen Therapy: Patient Spontanous Breathing and Patient connected to face mask oxygen  Post-op Assessment: Report given to RN and Post -op Vital signs reviewed and stable  Post vital signs: Reviewed and stable  Last Vitals:  Filed Vitals:   03/27/16 0709 03/27/16 0727  BP: 142/93   Pulse: 122 118  Temp: 36.6 C   Resp: 18 20    Last Pain: There were no vitals filed for this visit.       Complications: No apparent anesthesia complications

## 2016-03-27 NOTE — H&P (View-Only) (Signed)
History of Present Illness Darryl Ruff MD; [id number] 10:08 AM) Patient words: cancer.  The patient is a 55 year old male who presents with colorectal cancer. Pt goes by Darryl Foster. 55 year old male status post rectal cancer found on screening colonoscopy by Dr. Oletta Foster. He underwent a rectal ultrasound which revealed a a 15 mm lateral mass with invasion into the submucosa and possible muscularis invasion, T1 N0 or possible T2 N0. This was tattooed with Darryl Foster. CT scans of the chest abdomen and pelvis reveal no signs of metastatic disease. There were some small bilateral pulmonary nodules that will need follow-up in the future in 3-6 months. CEA level was 1.5. The mass is located approximately 10 cm from the anal verge.   Other Problems Darryl Lorenzo, LPN; 624THL [id number] AM) High blood pressure Rectal Cancer  Past Surgical History Darryl Lorenzo, LPN; 624THL [id number] AM) No pertinent past surgical history  Diagnostic Studies History Darryl Lorenzo, LPN; 624THL [id number] AM) Colonoscopy within last year  Allergies Darryl Lorenzo, LPN; 624THL [id number] AM) No Known Drug Allergies 03/16/2016  Medication History Darryl Lorenzo, LPN; 624THL [id number] AM) Lisinopril (10MG  Tablet, Oral) Active. Nasacort (55MCG/ACT Aerosol Soln, Nasal) Active. Medications Reconciled  Social History Darryl Lorenzo, LPN; 624THL [id number] AM) Alcohol use Remotely quit alcohol use. Caffeine use Coffee, Tea. No drug use Tobacco use Never smoker.  Family History Darryl Lorenzo, LPN; 624THL [id number] AM) Diabetes Mellitus Father. Heart Disease Father. Heart disease in male family member before age 27 Hypertension Father. Thyroid problems Mother, Sister.     Review of Systems Darryl Billings Dockery LPN; 624THL [id number] AM) General Not Present- Appetite Loss, Chills, Fatigue, Fever, Night Sweats, Weight Gain and Weight Loss. Skin Not Present- Change in Wart/Mole, Dryness, Hives, Jaundice, New Lesions,  Non-Healing Wounds, Rash and Ulcer. HEENT Present- Seasonal Allergies and Wears glasses/contact lenses. Not Present- Earache, Hearing Loss, Hoarseness, Nose Bleed, Oral Ulcers, Ringing in the Ears, Sinus Pain, Sore Throat, Visual Disturbances and Yellow Eyes. Respiratory Not Present- Bloody sputum, Chronic Cough, Difficulty Breathing, Snoring and Wheezing. Breast Not Present- Breast Mass, Breast Pain, Nipple Discharge and Skin Changes. Cardiovascular Not Present- Chest Pain, Difficulty Breathing Lying Down, Leg Cramps, Palpitations, Rapid Heart Rate, Shortness of Breath and Swelling of Extremities. Gastrointestinal Not Present- Abdominal Pain, Bloating, Bloody Stool, Change in Bowel Habits, Chronic diarrhea, Constipation, Difficulty Swallowing, Excessive gas, Gets full quickly at meals, Hemorrhoids, Indigestion, Nausea, Rectal Pain and Vomiting. Male Genitourinary Not Present- Blood in Urine, Change in Urinary Stream, Frequency, Impotence, Nocturia, Painful Urination, Urgency and Urine Leakage. Musculoskeletal Not Present- Back Pain, Joint Pain, Joint Stiffness, Muscle Pain, Muscle Weakness and Swelling of Extremities. Neurological Not Present- Decreased Memory, Fainting, Headaches, Numbness, Seizures, Tingling, Tremor, Trouble walking and Weakness. Psychiatric Not Present- Anxiety, Bipolar, Change in Sleep Pattern, Depression, Fearful and Frequent crying. Endocrine Not Present- Cold Intolerance, Excessive Hunger, Hair Changes, Heat Intolerance, Hot flashes and New Diabetes. Hematology Not Present- Easy Bruising, Excessive bleeding, Gland problems, HIV and Persistent Infections.  Vitals Darryl Billings Dockery LPN; 624THL 075-GRM AM) 03/16/2016 9:35 AM Weight: 185.6 lb Height: 67in Body Surface Area: 1.96 m Body Mass Index: 29.07 kg/m  Temp.: 97.13F(Oral)  Pulse: 119 (Regular)  BP: 148/92 (Sitting, Left Arm, Standard)      Physical Exam Darryl Ruff MD; [id number] 10:44  AM)  General Mental Status-Alert. General Appearance-Not in acute distress. Build & Nutrition-Well nourished. Posture-Normal posture. Gait-Normal.  Head and Neck Head-normocephalic, atraumatic with no lesions or palpable masses. Trachea-midline.  Chest and Lung Exam Chest and lung exam reveals -  on auscultation, normal breath sounds, no adventitious sounds and normal vocal resonance.  Cardiovascular Cardiovascular examination reveals -normal heart sounds, regular rate and rhythm with no murmurs.  Abdomen Inspection Inspection of the abdomen reveals - No Hernias. Palpation/Percussion Palpation and Percussion of the abdomen reveal - Soft, Non Tender, No Rigidity (guarding), No hepatosplenomegaly and No Palpable abdominal masses.  Rectal Note: deferred  Neurologic Neurologic evaluation reveals -alert and oriented x 3 with no impairment of recent or remote memory, normal attention span and ability to concentrate, normal sensation and normal coordination.  Musculoskeletal Normal Exam - Bilateral-Upper Extremity Strength Normal and Lower Extremity Strength Normal.    Assessment & Plan Darryl Ruff MD; [id number] 10:04 AM)  RECTAL CANCER (C20) Impression: 55 year old male status post rectal cancer found on screening colonoscopy. Ultrasound reveals early stage tumor. I have recommended low anterior resection. We have discussed this in detail. It is unlikely the patient will need any chemotherapy unless something changes on his final pathology. The surgery and anatomy were described to the patient as well as the risks of surgery and the possible complications. These include: Bleeding, deep abdominal infections and possible wound complications such as hernia and infection, damage to adjacent structures, leak of surgical connections, which can lead to other surgeries and possibly an ostomy, possible need for other procedures, such as abscess drains in radiology,  possible prolonged hospital stay, possible diarrhea from removal of part of the colon, possible constipation from narcotics, possible bowel, bladder or sexual dysfunction if having rectal surgery, prolonged fatigue/weakness or appetite loss, possible early recurrence of of disease, possible complications of their medical problems such as heart disease or arrhythmias or lung problems, death (less than 1%). I believe the patient understands and wishes to proceed with the surgery.

## 2016-03-27 NOTE — Anesthesia Procedure Notes (Signed)
Procedure Name: Intubation Date/Time: 03/27/2016 8:48 AM Performed by: Noralyn Pick D Pre-anesthesia Checklist: Patient identified, Emergency Drugs available, Suction available and Patient being monitored Patient Re-evaluated:Patient Re-evaluated prior to inductionOxygen Delivery Method: Circle System Utilized Preoxygenation: Pre-oxygenation with 100% oxygen Intubation Type: IV induction Ventilation: Mask ventilation without difficulty Laryngoscope Size: Mac and 4 Grade View: Grade II Tube type: Oral Tube size: 7.5 mm Number of attempts: 1 Airway Equipment and Method: Stylet and Oral airway Placement Confirmation: ETT inserted through vocal cords under direct vision,  positive ETCO2 and breath sounds checked- equal and bilateral Secured at: 22 cm Tube secured with: Tape Dental Injury: Teeth and Oropharynx as per pre-operative assessment

## 2016-03-28 LAB — CBC
HEMATOCRIT: 41.4 % (ref 39.0–52.0)
HEMOGLOBIN: 13.7 g/dL (ref 13.0–17.0)
MCH: 27.7 pg (ref 26.0–34.0)
MCHC: 33.1 g/dL (ref 30.0–36.0)
MCV: 83.6 fL (ref 78.0–100.0)
Platelets: 215 10*3/uL (ref 150–400)
RBC: 4.95 MIL/uL (ref 4.22–5.81)
RDW: 13.5 % (ref 11.5–15.5)
WBC: 8.8 10*3/uL (ref 4.0–10.5)

## 2016-03-28 LAB — BASIC METABOLIC PANEL
ANION GAP: 7 (ref 5–15)
BUN: 10 mg/dL (ref 6–20)
CALCIUM: 8.7 mg/dL — AB (ref 8.9–10.3)
CO2: 24 mmol/L (ref 22–32)
CREATININE: 1.01 mg/dL (ref 0.61–1.24)
Chloride: 108 mmol/L (ref 101–111)
GFR calc non Af Amer: >60 mL/min (ref >60)
Glucose, Bld: 142 mg/dL — ABNORMAL HIGH (ref 65–99)
Potassium: 4.2 mmol/L (ref 3.5–5.1)
SODIUM: 139 mmol/L (ref 135–145)

## 2016-03-28 NOTE — Progress Notes (Signed)
Received report from [place] and agree with assessment.

## 2016-03-28 NOTE — Progress Notes (Signed)
1 Day Post-Op  Subjective: No complaints. Passing flatus  Objective: Vital signs in last 24 hours: Temp:  [97.6 F (36.4 C)-99 F (37.2 C)] 97.9 F (36.6 C) (04/29 0600) Pulse Rate:  [74-127] 74 (04/29 0600) Resp:  [9-27] 17 (04/29 0600) BP: (123-152)/(70-92) 127/78 mmHg (04/29 0600) SpO2:  [97 %-100 %] 100 % (04/29 0600)    Intake/Output from previous day: 04/28 0701 - 04/29 0700 In: 6180 [P.O.:360; I.V.:5570; IV Piggyback:250] Out: 5533 [Urine:5000; Drains:483; Blood:50] Intake/Output this shift:    Resp: clear to auscultation bilaterally Cardio: regular rate and rhythm GI: soft, minimal tenderness. drain output serous. incisions look good  Lab Results:   Recent Labs  03/28/16 0352  WBC 8.8  HGB 13.7  HCT 41.4  PLT 215   BMET  Recent Labs  03/28/16 0352  NA 139  K 4.2  CL 108  CO2 24  GLUCOSE 142*  BUN 10  CREATININE 1.01  CALCIUM 8.7*   PT/INR No results for input(s): LABPROT, INR in the last 72 hours. ABG No results for input(s): PHART, HCO3 in the last 72 hours.  Invalid input(s): PCO2, PO2  Studies/Results: No results found.  Anti-infectives: Anti-infectives    Start     Dose/Rate Route Frequency Ordered Stop   03/27/16 2200  cefoTEtan (CEFOTAN) 2 g in dextrose 5 % 50 mL IVPB     2 g 100 mL/hr over 30 Minutes Intravenous Every 12 hours 03/27/16 1612 03/27/16 2229   03/27/16 0800  cefoTEtan (CEFOTAN) 2 g in dextrose 5 % 50 mL IVPB     2 g 100 mL/hr over 30 Minutes Intravenous On call to O.R. 03/27/16 0710 03/27/16 0849      Assessment/Plan: s/p Procedure(s): XI ROBOTIC ASSISTED LOWER ANTERIOR RESECTION (N/A) Advance diet. Start clears today Continue drain Leave foley in until tomorrow OOB  LOS: 1 day    TOTH III,Darryl Foster S 03/28/2016

## 2016-03-29 LAB — CBC
HCT: 43.2 % (ref 39.0–52.0)
HEMOGLOBIN: 14.2 g/dL (ref 13.0–17.0)
MCH: 27.6 pg (ref 26.0–34.0)
MCHC: 32.9 g/dL (ref 30.0–36.0)
MCV: 84 fL (ref 78.0–100.0)
PLATELETS: 225 10*3/uL (ref 150–400)
RBC: 5.14 MIL/uL (ref 4.22–5.81)
RDW: 13.7 % (ref 11.5–15.5)
WBC: 8.7 10*3/uL (ref 4.0–10.5)

## 2016-03-29 LAB — BASIC METABOLIC PANEL
ANION GAP: 9 (ref 5–15)
BUN: 7 mg/dL (ref 6–20)
CALCIUM: 8.7 mg/dL — AB (ref 8.9–10.3)
CHLORIDE: 104 mmol/L (ref 101–111)
CO2: 26 mmol/L (ref 22–32)
Creatinine, Ser: 1.01 mg/dL (ref 0.61–1.24)
GFR calc non Af Amer: >60 mL/min (ref >60)
Glucose, Bld: 120 mg/dL — ABNORMAL HIGH (ref 65–99)
Potassium: 3.9 mmol/L (ref 3.5–5.1)
Sodium: 139 mmol/L (ref 135–145)

## 2016-03-29 NOTE — Progress Notes (Signed)
2 Days Post-Op  Subjective: Feels good. Tolerated clears. No complaints  Objective: Vital signs in last 24 hours: Temp:  [97.7 F (36.5 C)-98.9 F (37.2 C)] 98 F (36.7 C) (04/30 0630) Pulse Rate:  [74-95] 95 (04/30 0630) Resp:  [18] 18 (04/30 0630) BP: (127-133)/(83-88) 127/88 mmHg (04/30 0630) SpO2:  [98 %-100 %] 99 % (04/30 0630)    Intake/Output from previous day: 04/29 0701 - 04/30 0700 In: 2580 [P.O.:1080; I.V.:1500] Out: 4695 [Urine:4550; Drains:145] Intake/Output this shift:    Resp: clear to auscultation bilaterally Cardio: regular rate and rhythm GI: soft, nontender. good bs  Lab Results:   Recent Labs  03/28/16 0352 03/29/16 0405  WBC 8.8 8.7  HGB 13.7 14.2  HCT 41.4 43.2  PLT 215 225   BMET  Recent Labs  03/28/16 0352 03/29/16 0405  NA 139 139  K 4.2 3.9  CL 108 104  CO2 24 26  GLUCOSE 142* 120*  BUN 10 7  CREATININE 1.01 1.01  CALCIUM 8.7* 8.7*   PT/INR No results for input(s): LABPROT, INR in the last 72 hours. ABG No results for input(s): PHART, HCO3 in the last 72 hours.  Invalid input(s): PCO2, PO2  Studies/Results: No results found.  Anti-infectives: Anti-infectives    Start     Dose/Rate Route Frequency Ordered Stop   03/27/16 2200  cefoTEtan (CEFOTAN) 2 g in dextrose 5 % 50 mL IVPB     2 g 100 mL/hr over 30 Minutes Intravenous Every 12 hours 03/27/16 1612 03/27/16 2229   03/27/16 0800  cefoTEtan (CEFOTAN) 2 g in dextrose 5 % 50 mL IVPB     2 g 100 mL/hr over 30 Minutes Intravenous On call to O.R. 03/27/16 0710 03/27/16 0849      Assessment/Plan: s/p Procedure(s): XI ROBOTIC ASSISTED LOWER ANTERIOR RESECTION (N/A) Advance diet  ambulate  LOS: 2 days    TOTH III,Ahriyah Vannest S 03/29/2016

## 2016-03-29 NOTE — Progress Notes (Signed)
Pt ambulating to the bathroom with standby assist,tolerating well. Wife at bedside. Visitors in

## 2016-03-30 LAB — BASIC METABOLIC PANEL
ANION GAP: 10 (ref 5–15)
BUN: 13 mg/dL (ref 6–20)
CALCIUM: 9.1 mg/dL (ref 8.9–10.3)
CO2: 26 mmol/L (ref 22–32)
Chloride: 104 mmol/L (ref 101–111)
Creatinine, Ser: 0.95 mg/dL (ref 0.61–1.24)
GFR calc Af Amer: >60 mL/min (ref >60)
GLUCOSE: 109 mg/dL — AB (ref 65–99)
Potassium: 3.7 mmol/L (ref 3.5–5.1)
Sodium: 140 mmol/L (ref 135–145)

## 2016-03-30 LAB — CBC
HCT: 45.4 % (ref 39.0–52.0)
HEMOGLOBIN: 15.2 g/dL (ref 13.0–17.0)
MCH: 27.7 pg (ref 26.0–34.0)
MCHC: 33.5 g/dL (ref 30.0–36.0)
MCV: 82.8 fL (ref 78.0–100.0)
PLATELETS: 233 10*3/uL (ref 150–400)
RBC: 5.48 MIL/uL (ref 4.22–5.81)
RDW: 13.3 % (ref 11.5–15.5)
WBC: 7.7 10*3/uL (ref 4.0–10.5)

## 2016-03-30 MED ORDER — TRAMADOL HCL 50 MG PO TABS: 50.0000 mg | ORAL_TABLET | Freq: Four times a day (QID) | ORAL | Status: DC | PRN

## 2016-03-30 MED ORDER — TRAMADOL HCL 50 MG PO TABS: 50.0000 mg | ORAL_TABLET | Freq: Four times a day (QID) | ORAL | Status: AC | PRN

## 2016-03-30 NOTE — Progress Notes (Signed)
Discharge instructions discussed with patient.  Patient verbalized agreement and understanding, prescription given for Tramadol

## 2016-03-30 NOTE — Discharge Instructions (Signed)

## 2016-03-30 NOTE — Discharge Summary (Signed)
Physician Discharge Summary  Patient ID: Prynce Barrozo MRN: [id number] DOB/AGE: 12-24-1960 55 y.o.  Admit date: 03/27/2016 Discharge date: 03/30/2016  Admission Diagnoses: Rectal cancer  Discharge Diagnoses:  Active Problems:   Rectal cancer [facility])   Discharged Condition: good  Hospital Course: Patient admitted after surgery.  Diet was advanced as tolerated.  Foley out on POD 2.  By POD 3 he was tolerating a diet and his pain was controlled.  He was having good bowel and bladder function.  He was in stable condition for discharge to home.  Consults: None  Significant Diagnostic Studies: labs: cbc, chemistry  Treatments: IV hydration, analgesia: Tramadol and surgery: robotic LAR  Discharge Exam: Blood pressure 139/82, pulse 86, temperature 98.1 F (36.7 C), temperature source Oral, resp. rate 18, height 5\' 7"  (1.702 m), weight 85.078 kg (187 lb 9 oz), SpO2 97 %. General appearance: alert and cooperative GI: soft, non-distended Incision/Wound: clean, dry, intact JP: SS fluid, removed prior to d/c  Disposition: 01-Home or Self Care     Medication List    ASK your doctor about these medications        lisinopril 10 MG tablet  Commonly known as:  PRINIVIL,ZESTRIL  Take 10 mg by mouth daily.     NASACORT ALLERGY 24HR 55 MCG/ACT Aero nasal inhaler  Generic drug:  triamcinolone  Place 1 spray into the nose daily.         SignedRosario Adie 0000000, [id number] AM

## 2016-04-01 ENCOUNTER — Telehealth: Payer: Self-pay | Admitting: General Surgery

## 2016-04-01 NOTE — Telephone Encounter (Signed)
Discussed path report with pt.  Stage 1 Rectal cancer

## 2016-04-07 ENCOUNTER — Encounter (HOSPITAL_COMMUNITY): Payer: Self-pay

## 2016-09-28 ENCOUNTER — Other Ambulatory Visit: Payer: Self-pay | Admitting: Internal Medicine

## 2016-09-28 DIAGNOSIS — R918 Other nonspecific abnormal finding of lung field: Secondary | ICD-10-CM

## 2016-10-06 ENCOUNTER — Ambulatory Visit
Admission: RE | Admit: 2016-10-06 | Discharge: 2016-10-06 | Disposition: A | Discharge status: Home / Self Care | Payer: PRIVATE HEALTH INSURANCE | Source: Ambulatory Visit | Attending: Internal Medicine | Admitting: Internal Medicine

## 2016-10-06 DIAGNOSIS — R918 Other nonspecific abnormal finding of lung field: Secondary | ICD-10-CM

## 2017-05-23 IMAGING — CT CT CHEST W/O CM
2 of 5 series · 15 of 36 positions shown, 18 images · non-contrast
Comparison: 03/10/2016

CLINICAL DATA: Follow-up pulmonary nodules

EXAM:
CT CHEST WITHOUT CONTRAST
TECHNIQUE: Multidetector CT imaging of the chest was performed following the
standard protocol without IV contrast.

[Series 3: cor · coronal · 0.63mm/px · 3 of 126 slices shown]
[im 26/126  lung]
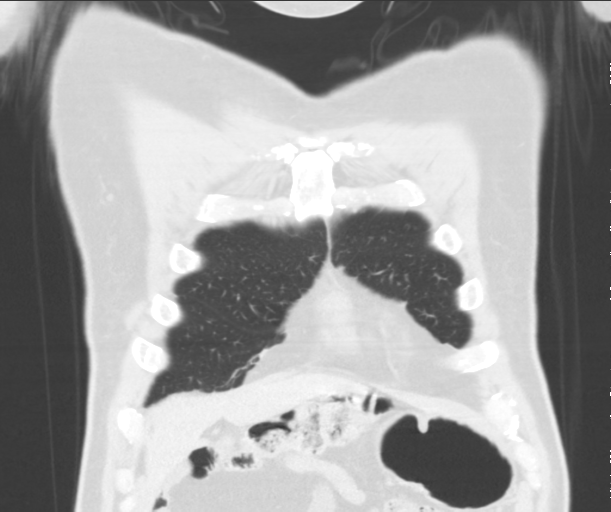
[im 51/126  lung]
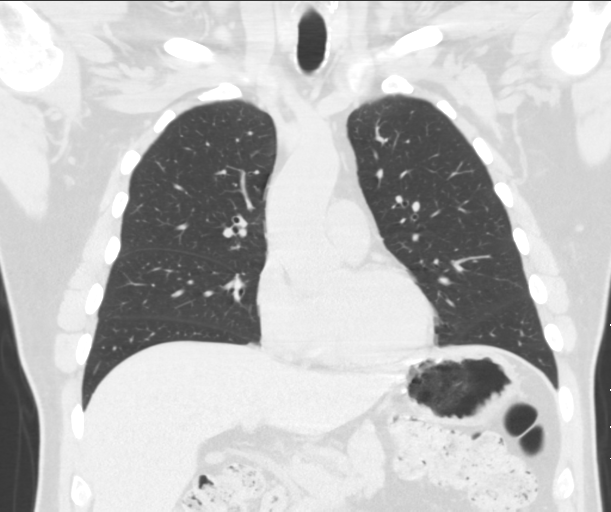
[im 76/126  lung]
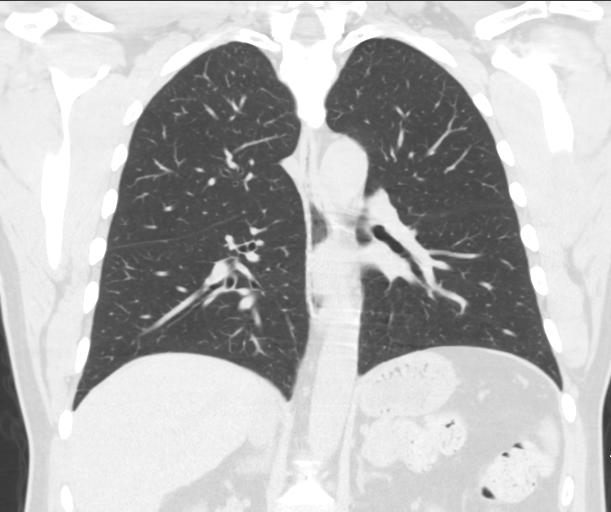

[Series 6: super d · axial · 0.73mm/px · z∈[+931,+1211]mm · 12 of 458 slices shown, 15 images]
[im 29/458  mediastinal]
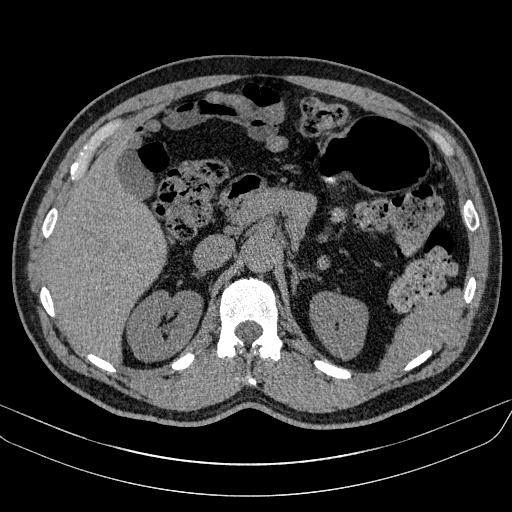
[im 29/458  lung]
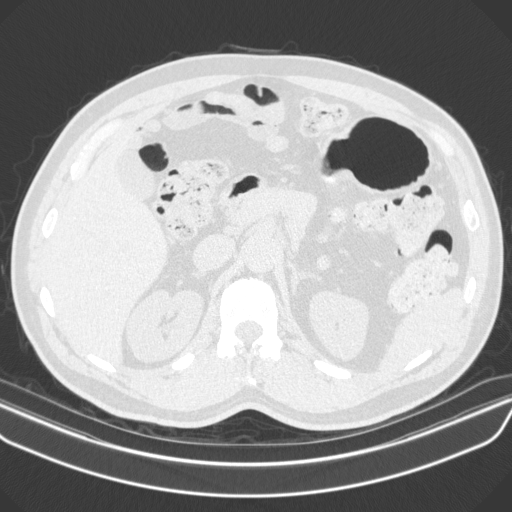
[im 58/458  lung]
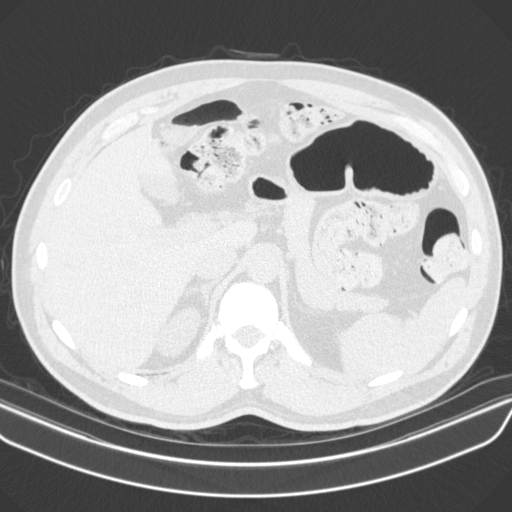
[im 115/458  lung]
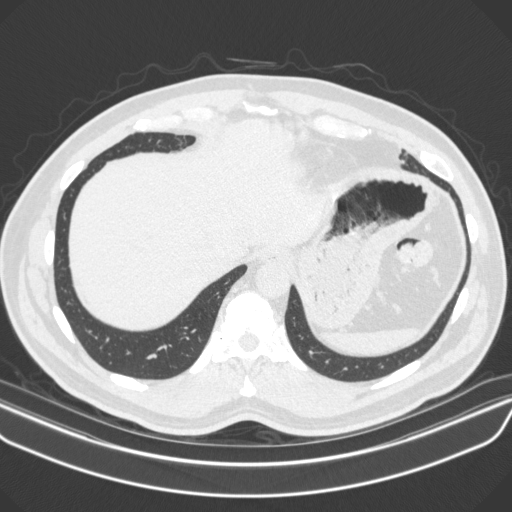
[im 143/458  lung]
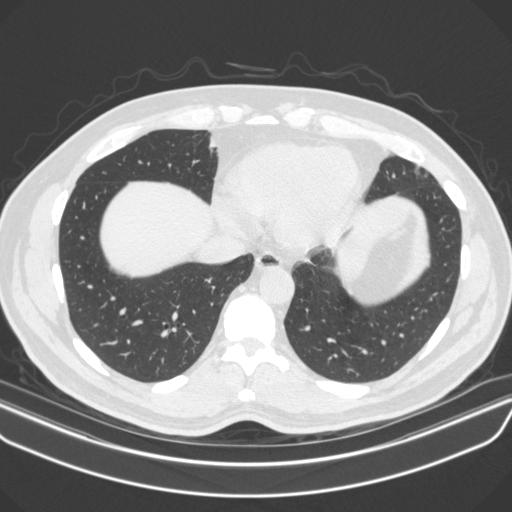
[im 172/458  mediastinal]
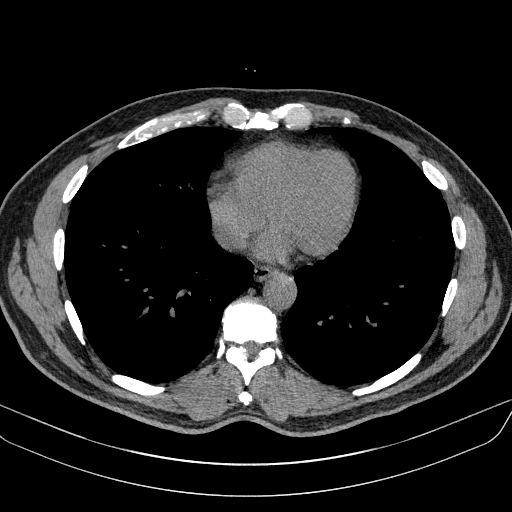
[im 172/458  lung]
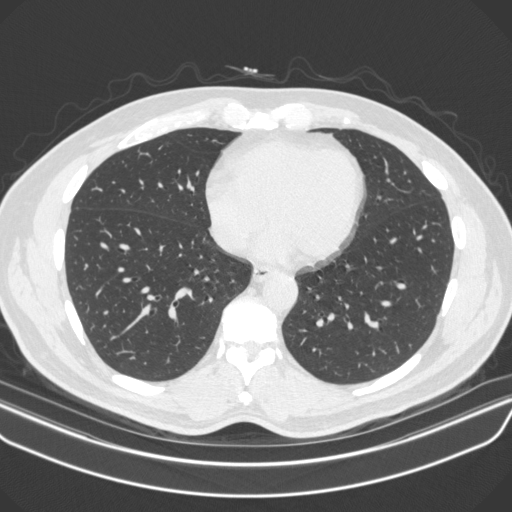
[im 200/458  lung]
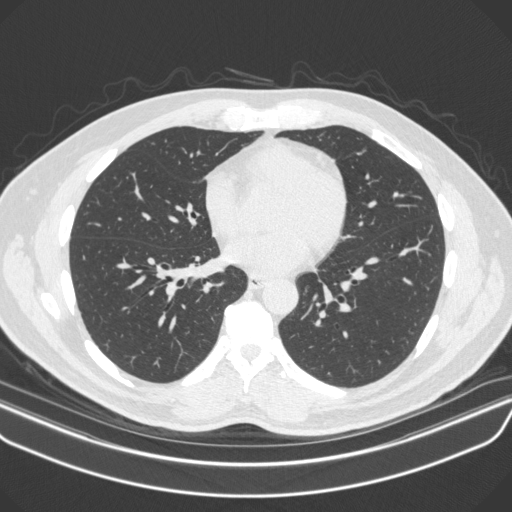
[im 258/458  lung]
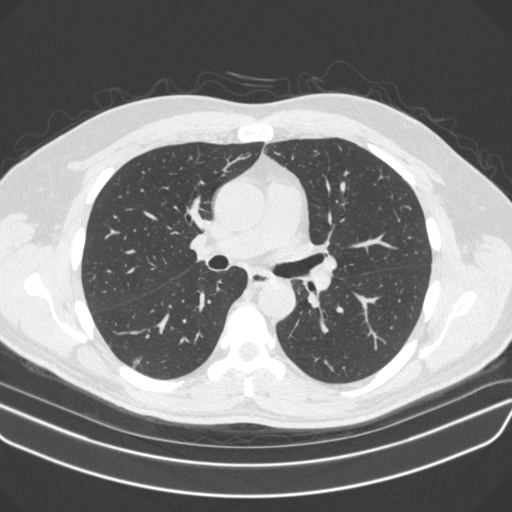
[im 286/458  lung]
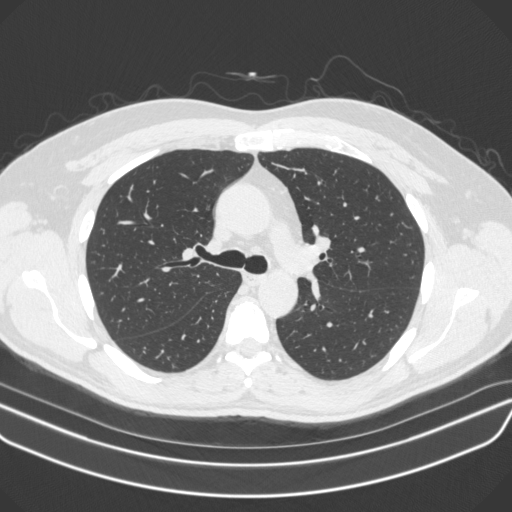
[im 315/458  mediastinal]
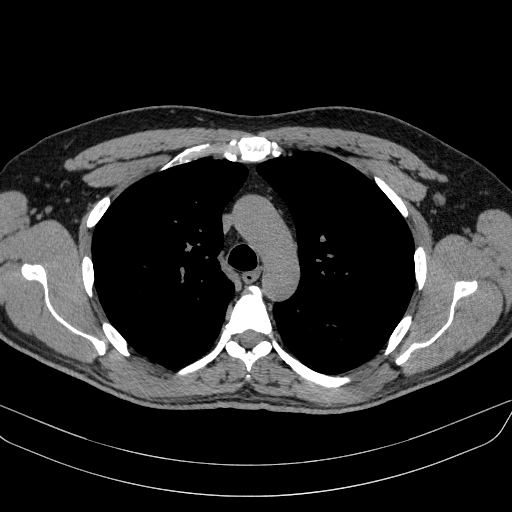
[im 315/458  lung]
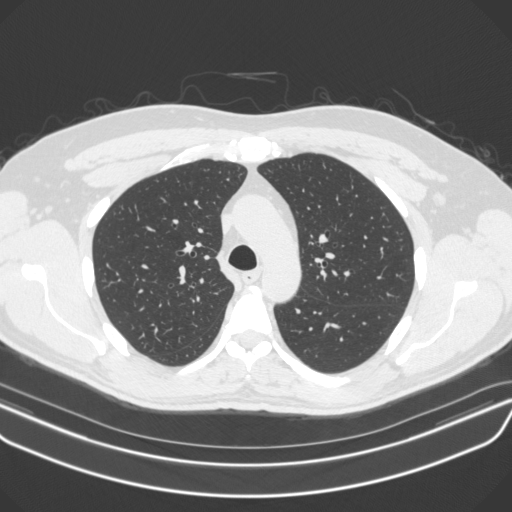
[im 343/458  lung]
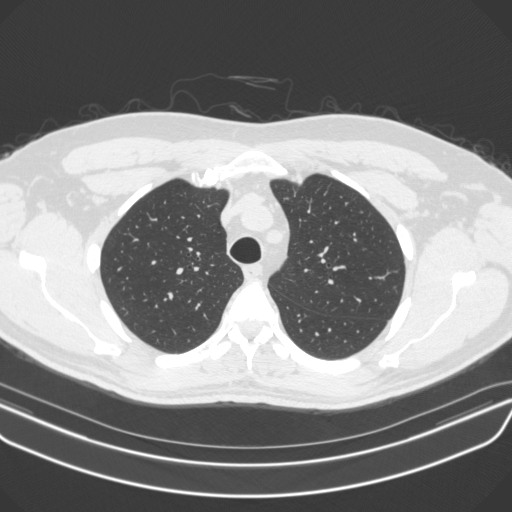
[im 400/458  lung]
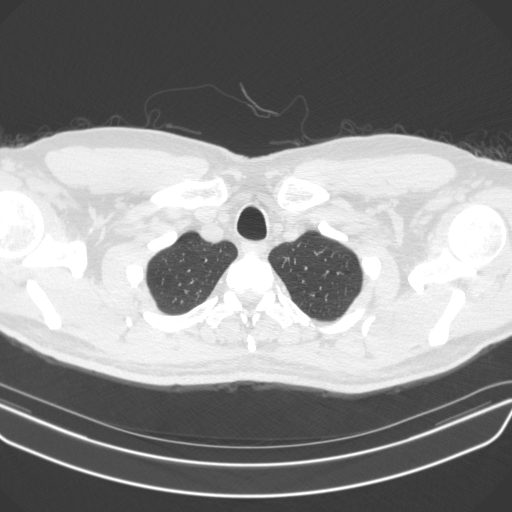
[im 429/458  lung]
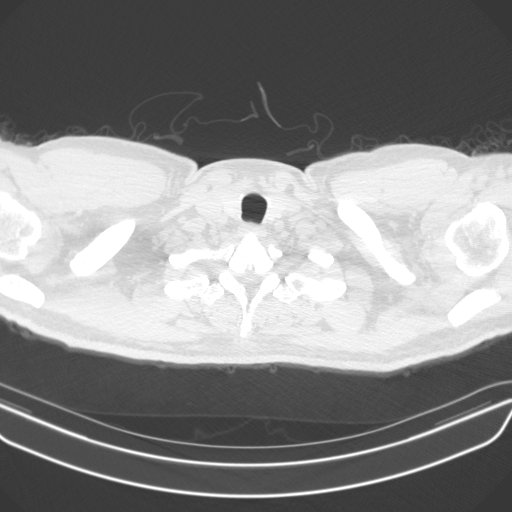

[15 of 36 positions shown; findings below may reference images not displayed]

FINDINGS: Cardiovascular: Somewhat limited by the lack of IV contrast. The
aorta is within normal limits. No significant enlargement of
cardiovascular structures is noted.

Mediastinum/Nodes: The thoracic inlet is within normal limits. No
hilar or mediastinal adenopathy is seen. No significant coronary
calcifications are noted. The esophagus as visualized is within
normal limits.

Lungs/Pleura: The lungs are again well aerated bilaterally. The
previously seen nodular densities are again seen in somewhat better
visualize due to improved imaging technique. There stable in
appearance from the prior exam. No new focal nodules are seen. No
focal effusion is noted.

Upper Abdomen: No acute abnormality.

Musculoskeletal: No chest wall mass or suspicious bone lesions
identified.
IMPRESSION: Scattered pulmonary nodules bilaterally. The overall appearance is
stable from the prior exam but better visualized due to improve the
imaging technique. No follow-up needed if patient is low-risk (and
has no known or suspected primary neoplasm). Non-contrast chest CT
can be considered in 12 months if patient is high-risk. This
recommendation follows the consensus statement: Guidelines for
Management of Incidental Pulmonary Nodules Detected on CT Images:

## 2021-04-08 ENCOUNTER — Telehealth: Payer: Self-pay | Admitting: Physician Assistant

## 2021-04-08 NOTE — Telephone Encounter (Signed)
Called to discuss with patient about COVID-19 symptoms and the use of one of the available treatments for those with mild to moderate Covid symptoms and at a high risk of hospitalization.  Pt appears to qualify for outpatient treatment due to co-morbid conditions and/or a member of an at-risk group in accordance with the FDA Emergency Use Authorization.    Symptom onset: unknown Vaccinated: yes per referral message Booster? unknown Immunocompromised? unknown Qualifiers: HTN, rectal cancer NIH Criteria: 3  Unable to reach pt - left Vm with hotline #  Angelena Form

## 2024-02-23 ENCOUNTER — Other Ambulatory Visit (HOSPITAL_COMMUNITY): Payer: Self-pay | Admitting: Internal Medicine

## 2024-02-23 DIAGNOSIS — E7800 Pure hypercholesterolemia, unspecified: Secondary | ICD-10-CM

## 2024-03-01 ENCOUNTER — Ambulatory Visit (HOSPITAL_COMMUNITY)
Admission: RE | Admit: 2024-03-01 | Discharge: 2024-03-01 | Disposition: A | Discharge status: Home / Self Care | Payer: PRIVATE HEALTH INSURANCE | Source: Ambulatory Visit | Attending: Internal Medicine | Admitting: Internal Medicine

## 2024-03-01 DIAGNOSIS — E7800 Pure hypercholesterolemia, unspecified: Secondary | ICD-10-CM | POA: Insufficient documentation
